# Patient Record
Sex: Male | Born: 1970
Health system: Southern US, Community
[De-identification: ages and names within clinical notes are randomized; demographics above are authoritative.]

## PROBLEM LIST (undated history)

## (undated) DIAGNOSIS — E785 Hyperlipidemia, unspecified: Secondary | ICD-10-CM

## (undated) DIAGNOSIS — F988 Other specified behavioral and emotional disorders with onset usually occurring in childhood and adolescence: Secondary | ICD-10-CM

## (undated) HISTORY — PX: WISDOM TOOTH EXTRACTION: SHX21

## (undated) HISTORY — DX: Other specified behavioral and emotional disorders with onset usually occurring in childhood and adolescence: F98.8

## (undated) HISTORY — DX: Hyperlipidemia, unspecified: E78.5

---

## 2010-07-17 ENCOUNTER — Encounter: Admission: RE | Admit: 2010-07-17 | Discharge: 2010-07-17 | Payer: Self-pay | Admitting: Orthopedic Surgery

## 2018-08-07 ENCOUNTER — Other Ambulatory Visit (HOSPITAL_COMMUNITY): Payer: Self-pay | Admitting: *Deleted

## 2018-08-07 DIAGNOSIS — E785 Hyperlipidemia, unspecified: Secondary | ICD-10-CM

## 2018-08-07 NOTE — Progress Notes (Signed)
Calcium Score CT ordered per Dr. Aundra Dubin.  Called [facility] per patient's request and they have scheduled him for Monday 8/19 @ 3:30.  Patient is aware he will have to pay $199.00 out of pocket.  No further questions.

## 2018-08-10 ENCOUNTER — Other Ambulatory Visit: Payer: Self-pay

## 2019-04-06 DIAGNOSIS — F902 Attention-deficit hyperactivity disorder, combined type: Secondary | ICD-10-CM | POA: Diagnosis not present

## 2019-04-06 DIAGNOSIS — F419 Anxiety disorder, unspecified: Secondary | ICD-10-CM | POA: Diagnosis not present

## 2019-05-24 DIAGNOSIS — E559 Vitamin D deficiency, unspecified: Secondary | ICD-10-CM | POA: Diagnosis not present

## 2019-05-24 DIAGNOSIS — F909 Attention-deficit hyperactivity disorder, unspecified type: Secondary | ICD-10-CM | POA: Diagnosis not present

## 2019-05-24 DIAGNOSIS — E782 Mixed hyperlipidemia: Secondary | ICD-10-CM | POA: Diagnosis not present

## 2019-05-24 DIAGNOSIS — Z79899 Other long term (current) drug therapy: Secondary | ICD-10-CM | POA: Diagnosis not present

## 2019-05-26 DIAGNOSIS — E559 Vitamin D deficiency, unspecified: Secondary | ICD-10-CM | POA: Diagnosis not present

## 2019-05-26 DIAGNOSIS — E782 Mixed hyperlipidemia: Secondary | ICD-10-CM | POA: Diagnosis not present

## 2019-09-06 DIAGNOSIS — E559 Vitamin D deficiency, unspecified: Secondary | ICD-10-CM | POA: Diagnosis not present

## 2019-09-06 DIAGNOSIS — E782 Mixed hyperlipidemia: Secondary | ICD-10-CM | POA: Diagnosis not present

## 2019-09-06 DIAGNOSIS — F909 Attention-deficit hyperactivity disorder, unspecified type: Secondary | ICD-10-CM | POA: Diagnosis not present

## 2019-09-06 DIAGNOSIS — F439 Reaction to severe stress, unspecified: Secondary | ICD-10-CM | POA: Diagnosis not present

## 2019-10-20 ENCOUNTER — Telehealth: Payer: Self-pay | Admitting: Gastroenterology

## 2019-10-20 NOTE — Telephone Encounter (Signed)
Patty, Can you reach out to [facility].  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  He is interested in colon cancer screening first time colonoscopy with some time in the next several weeks or so.  Thanks

## 2019-10-20 NOTE — Telephone Encounter (Signed)
Tried to reach pt and line rang and sounded like someone picked up but never answered.  I tried again and the line sounded as if it was a fax.  Will try later

## 2019-10-21 NOTE — Telephone Encounter (Signed)
Left message on machine to call back  

## 2019-10-22 NOTE — Telephone Encounter (Signed)
Left message on machine to call back   FYI:  Dr Ardis Hughs I have left several messages for the pt to return my call. I will also mail a letter.

## 2019-10-28 ENCOUNTER — Encounter: Payer: Self-pay | Admitting: Gastroenterology

## 2019-11-03 ENCOUNTER — Ambulatory Visit (AMBULATORY_SURGERY_CENTER): Payer: BC Managed Care – PPO | Admitting: *Deleted

## 2019-11-03 ENCOUNTER — Other Ambulatory Visit: Payer: Self-pay

## 2019-11-03 VITALS — Temp 97.3°F | Ht 68.5 in | Wt 148.0 lb

## 2019-11-03 DIAGNOSIS — Z1211 Encounter for screening for malignant neoplasm of colon: Secondary | ICD-10-CM

## 2019-11-03 DIAGNOSIS — Z1159 Encounter for screening for other viral diseases: Secondary | ICD-10-CM

## 2019-11-03 DIAGNOSIS — L409 Psoriasis, unspecified: Secondary | ICD-10-CM | POA: Insufficient documentation

## 2019-11-03 MED ORDER — NA SULFATE-K SULFATE-MG SULF 17.5-3.13-1.6 GM/177ML PO SOLN: ORAL | 0 refills | Status: DC

## 2019-11-03 NOTE — Progress Notes (Signed)
Patient is here in-person for PV. Patient denies any allergies to eggs or soy. Patient denies any problems with anesthesia/sedation. Patient denies any oxygen use at home. Patient denies taking any diet/weight loss medications or blood thinners. Patient is not being treated for MRSA or C-diff. EMMI education assisgned to the patient for the procedure, this was explained and instructions given to patient. COVID-19 screening test is on 11/18 Wed. At 925am, pt requested this date, the pt is aware. Pt is aware that care partner will wait in the car during procedure; if they feel like they will be too hot or cold to wait in the car; they may wait in the 4 th floor lobby. Patient is aware to bring only one care partner. We want them to wear a mask (we do not have any that we can provide them), practice social distancing, and we will check their temperatures when they get here.  I did remind the patient that their care partner needs to stay in the parking lot the entire time and have a cell phone available, we will call them when the pt is ready for discharge. Patient will wear mask into building.    Suprep $15 off coupon given to the patient.

## 2019-11-04 ENCOUNTER — Encounter: Payer: Self-pay | Admitting: Gastroenterology

## 2019-11-10 ENCOUNTER — Other Ambulatory Visit: Payer: Self-pay | Admitting: Gastroenterology

## 2019-11-10 ENCOUNTER — Ambulatory Visit (INDEPENDENT_AMBULATORY_CARE_PROVIDER_SITE_OTHER): Payer: BC Managed Care – PPO

## 2019-11-10 DIAGNOSIS — Z1159 Encounter for screening for other viral diseases: Secondary | ICD-10-CM

## 2019-11-10 LAB — SARS CORONAVIRUS 2 (TAT 6-24 HRS): SARS Coronavirus 2: NEGATIVE

## 2019-11-15 ENCOUNTER — Telehealth: Payer: Self-pay | Admitting: Gastroenterology

## 2019-11-15 ENCOUNTER — Encounter: Payer: Self-pay | Admitting: Gastroenterology

## 2019-11-15 NOTE — Telephone Encounter (Signed)
Left message on machine to call back  

## 2019-11-15 NOTE — Telephone Encounter (Signed)
Lovena Le texted me yesterday.  Starting yesterday his son is quarantining after Covid exposure. Out of abundance of caution I recommended he not prep and that we reschedule his colonoscopy.  Can you reach out to him to find another day for his screening exam.  Thanks

## 2019-11-16 NOTE — Telephone Encounter (Signed)
I spoke with the pt's wife and she tells me that the pt is out of town and will call back to set up colon some time after Dec. 3.  She is aware to have him ask for me at that time.

## 2019-12-24 HISTORY — PX: OTHER SURGICAL HISTORY: SHX169

## 2019-12-30 DIAGNOSIS — E559 Vitamin D deficiency, unspecified: Secondary | ICD-10-CM | POA: Diagnosis not present

## 2019-12-30 DIAGNOSIS — F439 Reaction to severe stress, unspecified: Secondary | ICD-10-CM | POA: Diagnosis not present

## 2019-12-30 DIAGNOSIS — E782 Mixed hyperlipidemia: Secondary | ICD-10-CM | POA: Diagnosis not present

## 2019-12-30 DIAGNOSIS — F909 Attention-deficit hyperactivity disorder, unspecified type: Secondary | ICD-10-CM | POA: Diagnosis not present

## 2020-05-08 ENCOUNTER — Encounter: Payer: Self-pay | Admitting: Family Medicine

## 2020-05-08 ENCOUNTER — Other Ambulatory Visit: Payer: Self-pay

## 2020-05-08 ENCOUNTER — Ambulatory Visit (INDEPENDENT_AMBULATORY_CARE_PROVIDER_SITE_OTHER): Payer: BC Managed Care – PPO | Admitting: Family Medicine

## 2020-05-08 VITALS — BP 124/80 | HR 95 | Temp 99.8°F | Ht 68.25 in | Wt 149.5 lb

## 2020-05-08 DIAGNOSIS — R202 Paresthesia of skin: Secondary | ICD-10-CM | POA: Diagnosis not present

## 2020-05-08 DIAGNOSIS — M21371 Foot drop, right foot: Secondary | ICD-10-CM

## 2020-05-08 DIAGNOSIS — R200 Anesthesia of skin: Secondary | ICD-10-CM | POA: Diagnosis not present

## 2020-05-08 DIAGNOSIS — M5416 Radiculopathy, lumbar region: Secondary | ICD-10-CM | POA: Diagnosis not present

## 2020-05-08 MED ORDER — PREDNISONE 20 MG PO TABS: ORAL_TABLET | ORAL | 0 refills | Status: DC

## 2020-05-08 MED ORDER — AMITRIPTYLINE HCL 25 MG PO TABS: 25.0000 mg | ORAL_TABLET | Freq: Every day | ORAL | 3 refills | Status: DC

## 2020-05-08 NOTE — Progress Notes (Addendum)
Jermaine Shelnutt T. [name], MD, [place] at [facility] [place], [address]  Phone: [phone]  FAX: [phone]  Jermaine Munoz - 49 y.o. male  MRN [id number]  Date of Birth: Feb 21, 1971  Date: 05/08/2020  PCP: System, Provider Not In  Referral: No ref. provider found  Chief Complaint  Patient presents with  . Back Pain    This visit occurred during the SARS-CoV-2 public health emergency.  Safety protocols were in place, including screening questions prior to the visit, additional usage of staff PPE, and extensive cleaning of exam room while observing appropriate contact time as indicated for disinfecting solutions.   Subjective:   Jermaine Munoz is a 49 y.o. very pleasant male patient with Body mass index is 22.57 kg/m. who presents with the following:  He is a pleasant well-known patient, but he is new to our medical practice.  He presents today with some acute right-sided back pain, radicular symptoms, numbness in the right foot and weakness including foot drop.  He has had pain in the right and down his right leg off and on for several years presumably from piriformis syndrome.  He is a very active athlete and runs quite a bit most days of the week.  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  He does have foot drop in the right and this is a new finding for the patient.  He was on-call over the weekend on Friday and then on Saturday it started to get more severe pain.  Sunday his big toe got numb and developed some foot drop and had waves of pain in the back and down his right leg.  He did take some Naprosyn all weekend as well as some Tylenol, but this did not really help his symptoms.  He does not have any history of any prior spine interventions or surgery.  R foot and was having some issues.   Took some naprisyn all weekend. alleve and tlenol   r foot  drop 1st toe r R LE R l foot 1st webspace on the R  Review of Systems is noted in the HPI, as appropriate   Patient Active Problem List   Diagnosis Date Noted  . Psoriasis 11/03/2019  . Nasal trauma 11/23/2012  . 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 ADD (attention deficit disorder)   . Hyperlipidemia     Past Surgical History:  Procedure Laterality Date  . WISDOM TOOTH EXTRACTION      Family History  Problem Relation Age of Onset  . Colon cancer Neg Hx   . Colon polyps Neg Hx   . Esophageal cancer Neg Hx   . Rectal cancer Neg Hx   . Stomach cancer Neg Hx      Objective:   BP 124/80   Pulse 95   Temp 99.8 F (37.7 C) (Temporal)   Ht 5' 8.25" (1.734 m)   Wt 149 lb 8 oz (67.8 kg)   SpO2 97%   BMI 22.57 kg/m    GEN: No acute distress; alert,appropriate.  He is lying on the examination table when I enter the room. PULM: Breathing comfortably in no respiratory distress PSYCH: Normally interactive.   Range of motion at  the waist: Flexion, extension, lateral bending and rotation: Limited to only about 30 to 40 degrees in forward flexion and extension  causes pain.  Lateral bending and rotation are more preserved.  No echymosis or edema Rises to examination table with mild difficulty Gait: minimally antalgic  Inspection/Deformity: N Paraspinus Tenderness: Diffuse from L3-S1 bilaterally as well as pain in the right upper buttocks region near the pelvic rim.  He does have some modest tenderness and tightness on the left piriformis but none on the right.  B Ankle Dorsiflexion (L5,4): 3-/5 B Great Toe Dorsiflexion (L5,4): 3-/5 Heel Walk (L5): Foot drop on the right Toe Walk (S1): WNL Rise/Squat (L4): WNL, mild pain  SENSORY B Medial Foot (L4): WNL B Dorsum (L5): WNL B Lateral (S1): Decreased on the lateral foot as well as the right lateral lower extremity. He also has decreased sensation in the forced dorsal webspace. Both of  these are with pinprick as well as light touch.    B SLR, seated: neg B SLR, supine: neg Both pain only without induction of greater radicular symptoms.  B FABER: neg B Reverse FABER: Tightness and pain in the buttocks region only B Greater Troch: NT B Log Roll: neg B Sciatic Notch: NT   Radiology: No results found.  Assessment and Plan:     ICD-10-CM   1. Lumbar radiculopathy, acute  M54.16   2. Foot drop, right  M21.371   3. Numbness and tingling of right lower extremity  R20.0    R20.2    Level of Medical Decision-Making in this case is MODERATE.   Acute right-sided lumbar radiculopathy with right lateral numbness in the lower extremity as well as foot drop.  Clinically secondary to acute disc herniation to the right.  The patient is well versed in rehab and stretching and he is going to do piriformis stretching as well as some basic back rehab as well.  14 days of prednisone, amitriptyline at night to stand for neuropathic pain and sleep.  Follow-up: Return in about 1 month (around 06/08/2020).  Meds ordered this encounter  Medications  . predniSONE (DELTASONE) 20 MG tablet    Sig: 2 tabs po for 7 days, then 1 tab po for 7 days    Dispense:  21 tablet    Refill:  0  . amitriptyline (ELAVIL) 25 MG tablet    Sig: Take 1-2 tablets (25-50 mg total) by mouth at bedtime.    Dispense:  60 tablet    Refill:  3   Medications Discontinued During This Encounter  Medication Reason  . atorvastatin (LIPITOR) 20 MG tablet Change in therapy  . Na Sulfate-K Sulfate-Mg Sulf 17.5-3.13-1.6 GM/177ML SOLN    No orders of the defined types were placed in this encounter.   Signed,  Maud Deed. Suleima Ohlendorf, MD   Outpatient Encounter Medications as of 05/08/2020  Medication Sig  . atorvastatin (LIPITOR) 10 MG tablet Take 10 mg by mouth daily.  . citalopram (CELEXA) 20 MG tablet Take 30 mg by mouth daily.  Marland Kitchen lisdexamfetamine (VYVANSE) 30 MG capsule Take 30 mg by mouth daily.  Marland Kitchen  amitriptyline (ELAVIL) 25 MG tablet Take 1-2 tablets (25-50 mg total) by mouth at bedtime.  . predniSONE (DELTASONE) 20 MG tablet 2 tabs po for 7 days, then 1 tab po for 7 days  . [DISCONTINUED] atorvastatin (LIPITOR) 20 MG tablet Take 10 mg by mouth daily.   . [DISCONTINUED] Na Sulfate-K Sulfate-Mg Sulf 17.5-3.13-1.6 GM/177ML SOLN Suprep (no substitutions)-TAKE AS DIRECTED.   No facility-administered encounter medications on file as of 05/08/2020.

## 2020-05-30 ENCOUNTER — Other Ambulatory Visit: Payer: Self-pay | Admitting: Family Medicine

## 2020-08-18 ENCOUNTER — Other Ambulatory Visit: Payer: Self-pay

## 2020-08-30 ENCOUNTER — Other Ambulatory Visit: Payer: Self-pay | Admitting: Family Medicine

## 2020-08-30 MED ORDER — PREDNISONE 20 MG PO TABS: ORAL_TABLET | ORAL | 0 refills | Status: DC

## 2020-08-30 NOTE — Progress Notes (Signed)
We discussed verbally, and he has had a recurrence of radiculopathy, so I sent him in another round of prednisone.   Meds ordered this encounter  Medications   predniSONE (DELTASONE) 20 MG tablet    Sig: 2 tabs po for 7 days, then 1 tab po for 7 days    Dispense:  21 tablet    Refill:  0

## 2020-09-06 DIAGNOSIS — M5416 Radiculopathy, lumbar region: Secondary | ICD-10-CM | POA: Diagnosis not present

## 2020-09-06 DIAGNOSIS — M545 Low back pain: Secondary | ICD-10-CM | POA: Diagnosis not present

## 2020-09-07 ENCOUNTER — Ambulatory Visit
Admission: RE | Admit: 2020-09-07 | Discharge: 2020-09-07 | Disposition: A | Discharge status: Home / Self Care | Payer: BC Managed Care – PPO | Source: Ambulatory Visit | Attending: Family Medicine | Admitting: Family Medicine

## 2020-09-07 DIAGNOSIS — M4807 Spinal stenosis, lumbosacral region: Secondary | ICD-10-CM | POA: Diagnosis not present

## 2020-09-07 DIAGNOSIS — M21371 Foot drop, right foot: Secondary | ICD-10-CM

## 2020-09-07 DIAGNOSIS — R202 Paresthesia of skin: Secondary | ICD-10-CM

## 2020-09-07 DIAGNOSIS — M5416 Radiculopathy, lumbar region: Secondary | ICD-10-CM

## 2020-09-07 MED ORDER — PREDNISONE 20 MG PO TABS: 40.0000 mg | ORAL_TABLET | Freq: Every day | ORAL | 0 refills | Status: AC

## 2020-09-07 NOTE — Addendum Note (Signed)
Addended by: Owens Loffler on: 09/07/2020 12:08 PM   Modules accepted: Orders

## 2020-09-09 ENCOUNTER — Other Ambulatory Visit: Payer: Self-pay | Admitting: Family Medicine

## 2020-09-10 ENCOUNTER — Other Ambulatory Visit: Payer: Self-pay | Admitting: Family Medicine

## 2020-09-12 DIAGNOSIS — M5416 Radiculopathy, lumbar region: Secondary | ICD-10-CM | POA: Diagnosis not present

## 2020-09-12 DIAGNOSIS — M545 Low back pain: Secondary | ICD-10-CM | POA: Diagnosis not present

## 2020-09-15 ENCOUNTER — Ambulatory Visit
Admission: RE | Admit: 2020-09-15 | Discharge: 2020-09-15 | Disposition: A | Discharge status: Home / Self Care | Payer: BC Managed Care – PPO | Source: Ambulatory Visit | Attending: Family Medicine | Admitting: Family Medicine

## 2020-09-15 ENCOUNTER — Other Ambulatory Visit: Payer: Self-pay

## 2020-09-15 ENCOUNTER — Other Ambulatory Visit: Payer: Self-pay | Admitting: Family Medicine

## 2020-09-15 DIAGNOSIS — R200 Anesthesia of skin: Secondary | ICD-10-CM

## 2020-09-15 DIAGNOSIS — R202 Paresthesia of skin: Secondary | ICD-10-CM

## 2020-09-15 DIAGNOSIS — M4727 Other spondylosis with radiculopathy, lumbosacral region: Secondary | ICD-10-CM | POA: Diagnosis not present

## 2020-09-15 DIAGNOSIS — M5117 Intervertebral disc disorders with radiculopathy, lumbosacral region: Secondary | ICD-10-CM | POA: Diagnosis not present

## 2020-09-15 DIAGNOSIS — M21371 Foot drop, right foot: Secondary | ICD-10-CM

## 2020-09-15 DIAGNOSIS — M5416 Radiculopathy, lumbar region: Secondary | ICD-10-CM

## 2020-09-15 DIAGNOSIS — M48061 Spinal stenosis, lumbar region without neurogenic claudication: Secondary | ICD-10-CM | POA: Diagnosis not present

## 2020-09-15 NOTE — Progress Notes (Signed)
MRI order:  He is a very well-known gentleman and originally saw him in May, 2021.  I have communicated with him a few other times in that interval.  He has failed multiple rounds of oral prednisone, and he has been doing formal physical therapy as well as daily home exercise program.  At this point he is failed conservative management and he still has at this point severe back pain with radicular symptoms and some foot drop on the right side.  Obtain an MRI of the lumbar spine to evaluate for cord edema, spinal stenosis, foraminal stenosis or other acute lumbar spine pathology that would be causing his radicular symptoms and pain on the right.    ICD-10-CM   1. Lumbar radiculopathy, acute  M54.16 MR Lumbar Spine Wo Contrast  2. Foot drop, right  M21.371 MR Lumbar Spine Wo Contrast  3. Numbness and tingling of right lower extremity  R20.0 MR Lumbar Spine Wo Contrast   R20.2   \

## 2020-09-16 ENCOUNTER — Other Ambulatory Visit: Payer: Self-pay | Admitting: Family Medicine

## 2020-09-16 MED ORDER — PREDNISONE 20 MG PO TABS: ORAL_TABLET | ORAL | 0 refills | Status: DC

## 2020-09-17 DIAGNOSIS — M5126 Other intervertebral disc displacement, lumbar region: Secondary | ICD-10-CM | POA: Diagnosis not present

## 2020-09-19 DIAGNOSIS — M5126 Other intervertebral disc displacement, lumbar region: Secondary | ICD-10-CM | POA: Diagnosis not present

## 2020-09-19 DIAGNOSIS — M5117 Intervertebral disc disorders with radiculopathy, lumbosacral region: Secondary | ICD-10-CM | POA: Diagnosis not present

## 2020-10-18 DIAGNOSIS — M5416 Radiculopathy, lumbar region: Secondary | ICD-10-CM | POA: Diagnosis not present

## 2020-11-23 ENCOUNTER — Other Ambulatory Visit: Payer: Self-pay | Admitting: Family Medicine

## 2020-11-23 NOTE — Telephone Encounter (Signed)
Last office visit 05/08/2020 for Lumbar radiculopathy.  Last refilled 05/30/2020 for #180 with no refills.  No future appointments.  Refill?

## 2021-02-07 DIAGNOSIS — E785 Hyperlipidemia, unspecified: Secondary | ICD-10-CM | POA: Diagnosis not present

## 2021-02-07 DIAGNOSIS — F909 Attention-deficit hyperactivity disorder, unspecified type: Secondary | ICD-10-CM | POA: Diagnosis not present

## 2021-04-05 DIAGNOSIS — E785 Hyperlipidemia, unspecified: Secondary | ICD-10-CM | POA: Diagnosis not present

## 2021-04-05 DIAGNOSIS — F439 Reaction to severe stress, unspecified: Secondary | ICD-10-CM | POA: Diagnosis not present

## 2021-04-05 DIAGNOSIS — F909 Attention-deficit hyperactivity disorder, unspecified type: Secondary | ICD-10-CM | POA: Diagnosis not present

## 2021-04-05 DIAGNOSIS — E559 Vitamin D deficiency, unspecified: Secondary | ICD-10-CM | POA: Diagnosis not present

## 2021-07-06 DIAGNOSIS — Z1211 Encounter for screening for malignant neoplasm of colon: Secondary | ICD-10-CM | POA: Diagnosis not present

## 2021-10-08 DIAGNOSIS — E785 Hyperlipidemia, unspecified: Secondary | ICD-10-CM | POA: Diagnosis not present

## 2021-10-08 DIAGNOSIS — F439 Reaction to severe stress, unspecified: Secondary | ICD-10-CM | POA: Diagnosis not present

## 2021-10-08 DIAGNOSIS — F909 Attention-deficit hyperactivity disorder, unspecified type: Secondary | ICD-10-CM | POA: Diagnosis not present

## 2022-02-26 DIAGNOSIS — F909 Attention-deficit hyperactivity disorder, unspecified type: Secondary | ICD-10-CM | POA: Diagnosis not present

## 2022-02-26 DIAGNOSIS — F439 Reaction to severe stress, unspecified: Secondary | ICD-10-CM | POA: Diagnosis not present

## 2022-02-26 DIAGNOSIS — E785 Hyperlipidemia, unspecified: Secondary | ICD-10-CM | POA: Diagnosis not present

## 2022-03-01 IMAGING — MR MR LUMBAR SPINE W/O CM
4 of 5 series · 25 of 48 positions shown · non-contrast
Comparison: Lumbar radiographs 09/07/2020

CLINICAL DATA: Right lower extremity radiculopathy and right foot
drop.

EXAM:
MRI LUMBAR SPINE WITHOUT CONTRAST
TECHNIQUE: Multiplanar, multisequence MR imaging of the lumbar spine was
performed. No intravenous contrast was administered.

[Series 4: T2 · sagittal · 4.0mm · 0.55mm/px · 6 of 17 slices shown (1 of 2)]
[im 1/17]
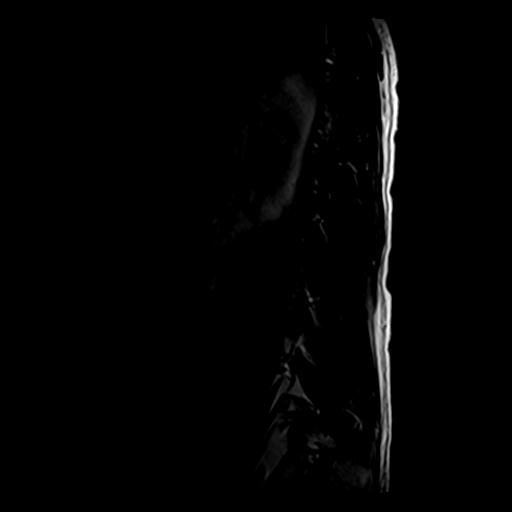
[im 4/17]
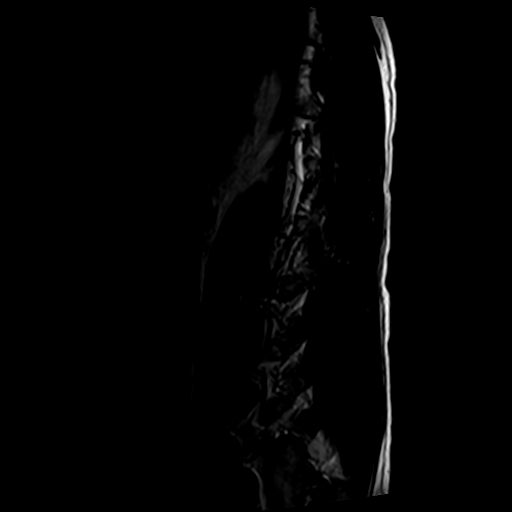
[im 7/17]
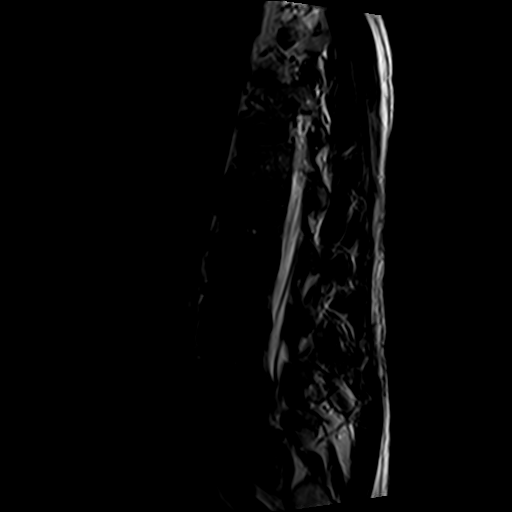
[im 10/17]
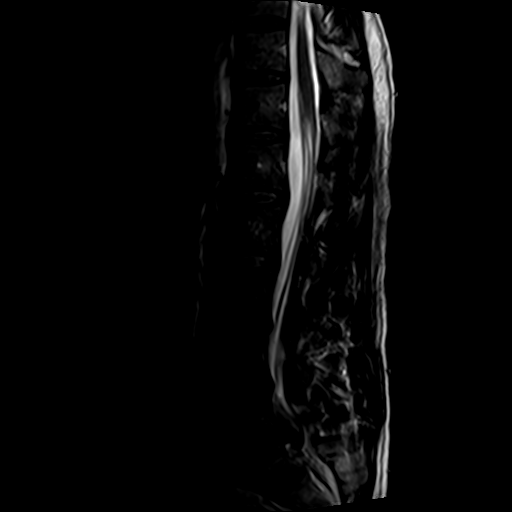
[im 13/17]
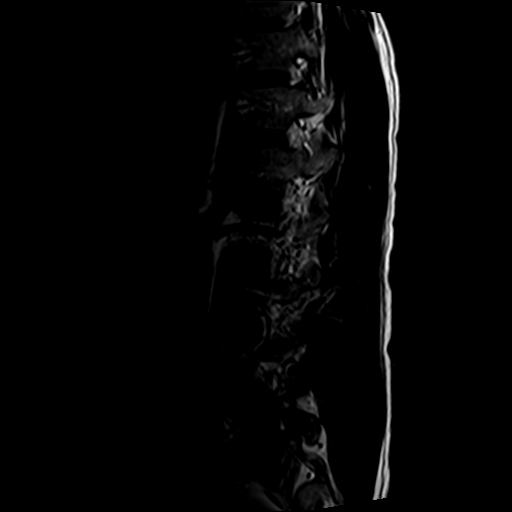
[im 17/17]
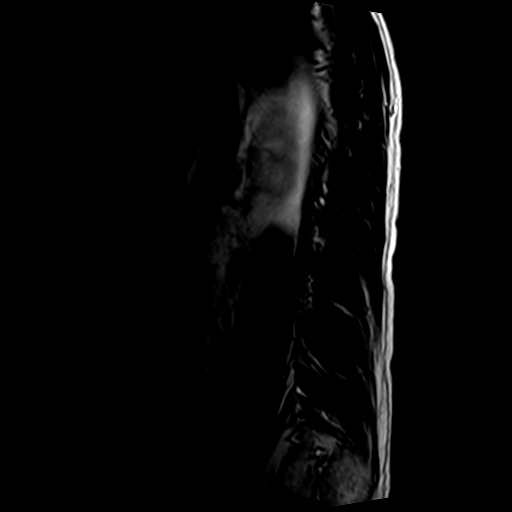

[Series 6: T1 · sagittal · 4.0mm · 0.55mm/px · 7 of 17 slices shown (1 of 2)]
[im 1/17]
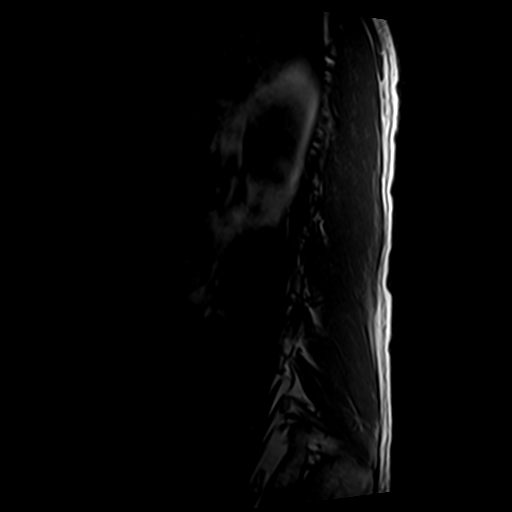
[im 3/17]
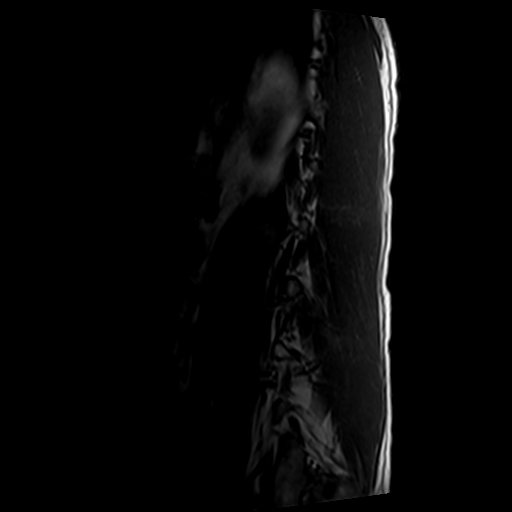
[im 6/17]
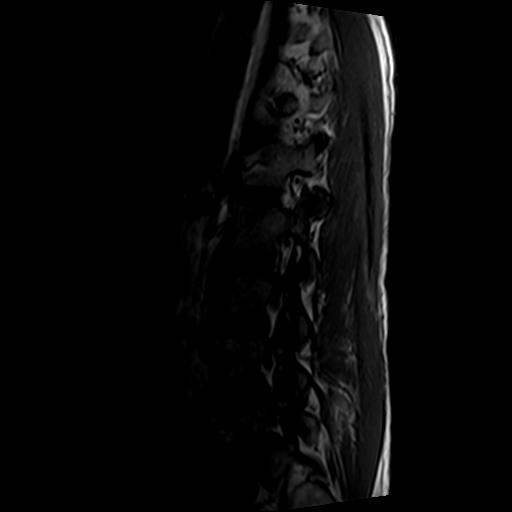
[im 9/17]
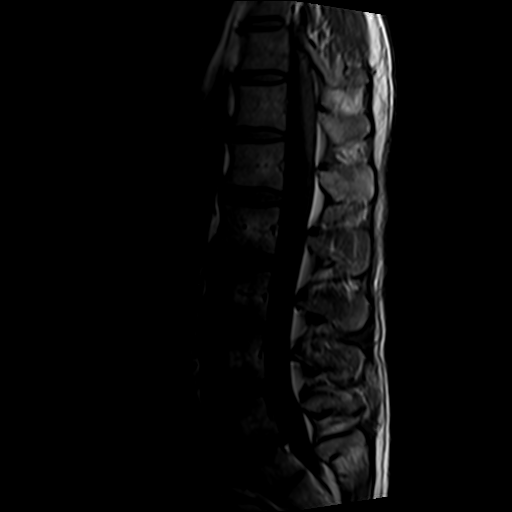
[im 11/17]
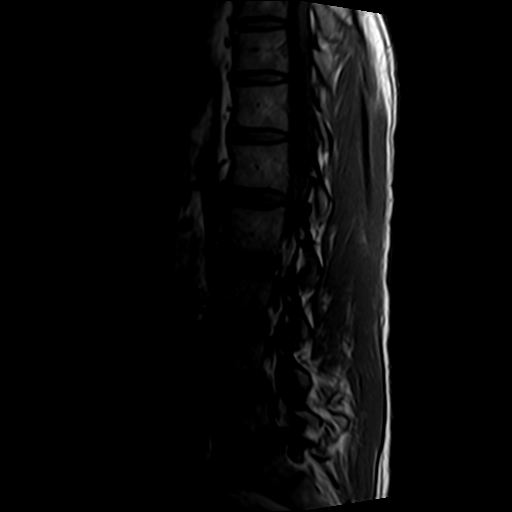
[im 14/17]
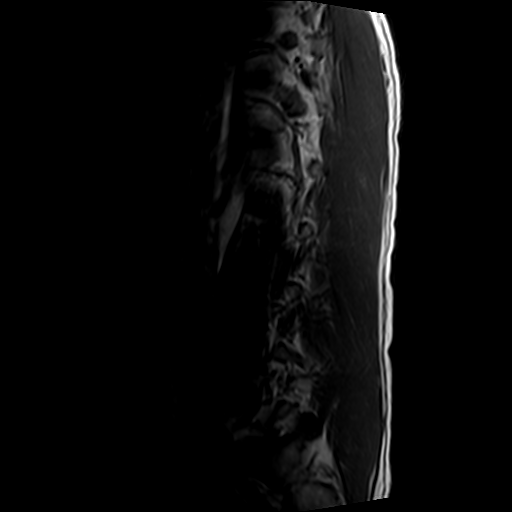
[im 17/17]
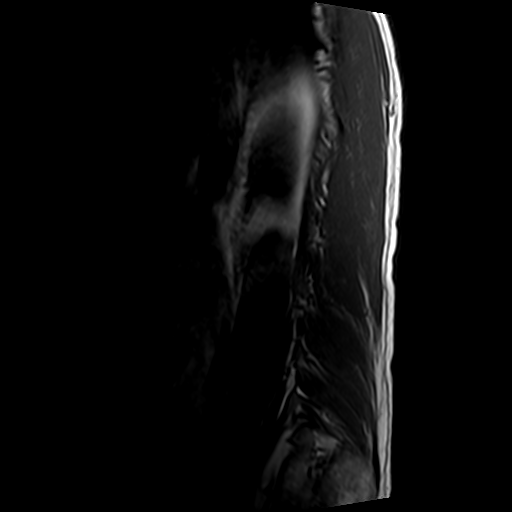

[Series 7: T2 · axial · 4.0mm · 0.70mm/px · z∈[-83,+109]mm · 8 of 37 slices shown (2 of 2)]
[im 1/37]
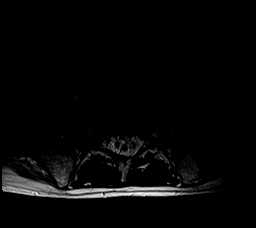
[im 6/37]
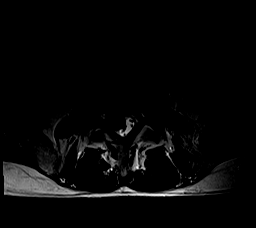
[im 12/37]
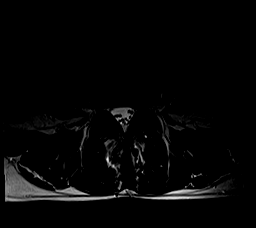
[im 17/37]
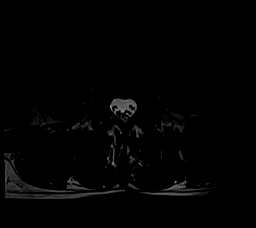
[im 20/37]
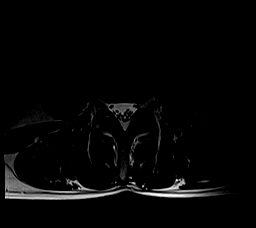
[im 25/37]
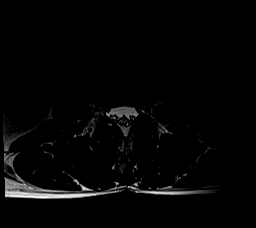
[im 31/37]
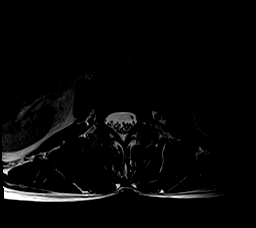
[im 37/37]
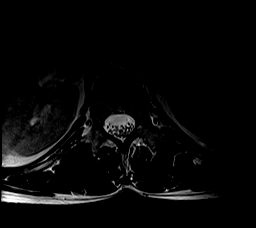

[Series 8: T1 · axial · 4.0mm · 0.35mm/px · z∈[-83,+78]mm · 4 of 37 slices shown (2 of 2)]
[im 1/37]
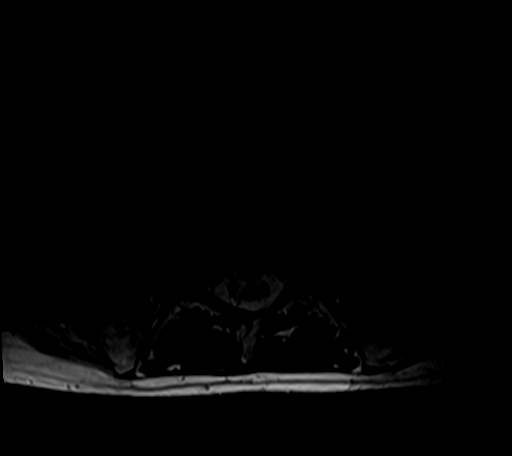
[im 6/37]
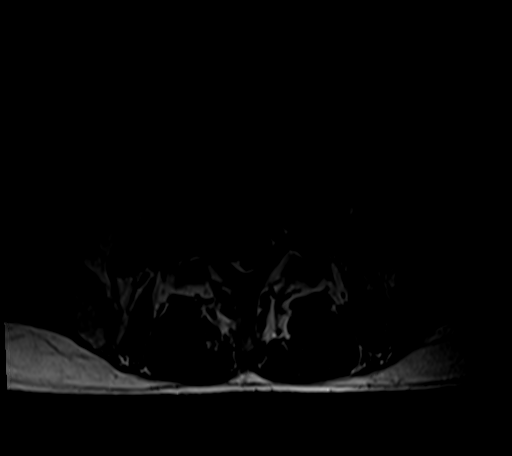
[im 20/37]
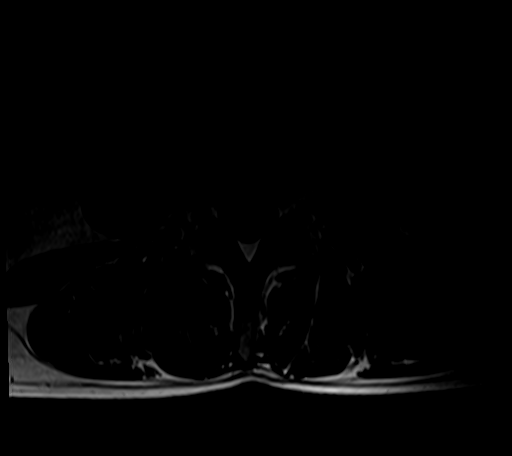
[im 31/37]
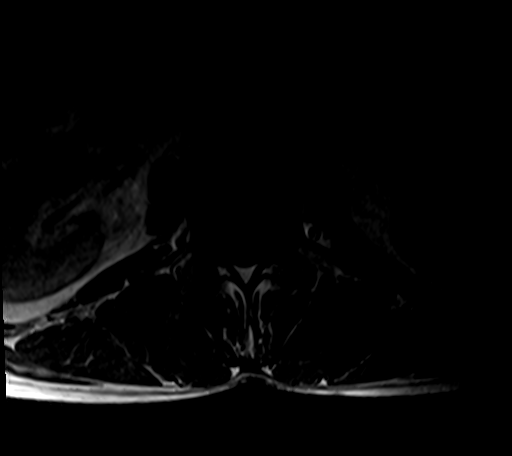

[25 of 48 positions shown; findings below may reference images not displayed]

FINDINGS: Segmentation: There are five lumbar type vertebral bodies. The last
full intervertebral disc space is labeled L5-S1. This correlates
with the lumbar radiographs

Alignment:  Normal

Vertebrae: Normal marrow signal. No bone lesions or fractures. Mild
degenerative endplate reactive changes at L5-S1.

Conus medullaris and cauda equina: Conus extends to the L1 level.
Conus and cauda equina appear normal.

Paraspinal and other soft tissues: No significant paraspinal or
retroperitoneal findings.

Disc levels:

L1-2: No significant findings.

L2-3: No significant findings.

L3-4: Mild disc desiccation and slight disc space narrowing. Slight
bulging annulus with mild flattening of the ventral thecal sac. No
spinal, lateral recess or foraminal stenosis.

L4-5: Mild annular bulge and mild facet disease but no disc
protrusions, spinal or foraminal stenosis.

L5-S1: There is a very large right paracentral disc extrusion both
up behind the L5 vertebral body and down behind the S1 vertebral
body. There is significant mass effect on the right side of the
thecal sac and on the right S1 nerve root. There could be irritation
of the right S2 nerve root also. The L5 nerve root does not appear
to be affected. Mild to moderate facet disease at this level.
IMPRESSION: Very large right paracentral disc extrusion at L5-S1 with
significant mass effect on the right side of the thecal sac and on
the right S1 nerve root.

## 2022-05-06 ENCOUNTER — Encounter: Payer: Self-pay | Admitting: Gastroenterology

## 2022-06-17 ENCOUNTER — Ambulatory Visit (AMBULATORY_SURGERY_CENTER): Payer: Self-pay

## 2022-06-17 VITALS — Ht 68.5 in | Wt 149.0 lb

## 2022-06-17 DIAGNOSIS — Z1211 Encounter for screening for malignant neoplasm of colon: Secondary | ICD-10-CM

## 2022-06-17 MED ORDER — NA SULFATE-K SULFATE-MG SULF 17.5-3.13-1.6 GM/177ML PO SOLN: 1.0000 | Freq: Once | ORAL | 0 refills | Status: AC

## 2022-06-17 NOTE — Progress Notes (Signed)
No egg or soy allergy known to patient  No issues known to pt with past sedation with any surgeries or procedures Patient denies ever being told they had issues or difficulty with intubation  No FH of Malignant Hyperthermia Pt is not on diet pills Pt is not on  home 02  Pt is not on blood thinners  Pt denies issues with constipation  No A fib or A flutter   NO PA's for preps discussed with pt In PV today  Discussed with pt there will be an out-of-pocket cost for prep and that varies from $0 to 70 +  dollars - pt verbalized understanding  Pt instructed to use Singlecare.com or GoodRx for a price reduction on prep   PV completed over the phone. Pt verified name, DOB, address and insurance during PV today.   Pt mailed instruction packet with copy of consent form to read and not return, and instructions.   Pt encouraged to call with questions or issues.  If pt has My chart, procedure instructions sent via My Chart  Insurance confirmed with pt at PV today   

## 2022-07-08 ENCOUNTER — Encounter: Payer: Self-pay | Admitting: Certified Registered Nurse Anesthetist

## 2022-07-10 ENCOUNTER — Encounter: Payer: Self-pay | Admitting: Gastroenterology

## 2022-07-15 ENCOUNTER — Ambulatory Visit (AMBULATORY_SURGERY_CENTER): Payer: BC Managed Care – PPO | Admitting: Gastroenterology

## 2022-07-15 ENCOUNTER — Encounter: Payer: Self-pay | Admitting: Gastroenterology

## 2022-07-15 VITALS — BP 106/66 | HR 69 | Temp 99.3°F | Resp 13 | Ht 68.0 in | Wt 149.0 lb

## 2022-07-15 DIAGNOSIS — D125 Benign neoplasm of sigmoid colon: Secondary | ICD-10-CM | POA: Diagnosis not present

## 2022-07-15 DIAGNOSIS — Z1211 Encounter for screening for malignant neoplasm of colon: Secondary | ICD-10-CM

## 2022-07-15 MED ORDER — SODIUM CHLORIDE 0.9 % IV SOLN: 500.0000 mL | Freq: Once | INTRAVENOUS | Status: DC

## 2022-07-15 NOTE — Progress Notes (Signed)
HPI: This is a man with routine risk for CRC   ROS: complete GI ROS as described in HPI, all other review negative.  Constitutional:  No unintentional weight loss   Past Medical History:  Diagnosis Date   ADD (attention deficit disorder)    Hyperlipidemia     Past Surgical History:  Procedure Laterality Date   discectemy Right 2021   L5-S1   WISDOM TOOTH EXTRACTION      Current Outpatient Medications  Medication Sig Dispense Refill   atorvastatin (LIPITOR) 20 MG tablet Take 20 mg by mouth daily.     citalopram (CELEXA) 40 MG tablet Take 40 mg by mouth daily. Takes total of 30 mg daily     lisdexamfetamine (VYVANSE) 30 MG capsule Take 30 mg by mouth daily.     PREVIDENT 5000 PLUS 1.1 % CREA dental cream Take by mouth daily.     Current Facility-Administered Medications  Medication Dose Route Frequency Provider Last Rate Last Admin   0.9 %  sodium chloride infusion  500 mL Intravenous Once Milus Banister, MD        Allergies as of 07/15/2022   (No Known Allergies)    Family History  Problem Relation Age of Onset   Colon cancer Neg Hx    Colon polyps Neg Hx    Esophageal cancer Neg Hx    Rectal cancer Neg Hx    Stomach cancer Neg Hx     Social History   Socioeconomic History   Marital status: Married    Spouse name: Not on file   Number of children: Not on file   Years of education: Not on file   Highest education level: Not on file  Occupational History   Not on file  Tobacco Use   Smoking status: Never   Smokeless tobacco: Never  Vaping Use   Vaping Use: Never used  Substance and Sexual Activity   Alcohol use: Yes    Comment: once a week wine   Drug use: Never   Sexual activity: Yes  Other Topics Concern   Not on file  Social History Narrative   Not on file   Social Determinants of Health   Financial Resource Strain: Not on file  Food Insecurity: Not on file  Transportation Needs: Not on file  Physical Activity: Not on file  Stress: Not  on file  Social Connections: Not on file  Intimate Partner Violence: Not on file     Physical Exam: BP (!) 125/57   Pulse (!) 104   Temp 99.3 F (37.4 C)   Ht '5\' 8"'$  (1.727 m)   Wt 149 lb (67.6 kg)   SpO2 98%   BMI 22.66 kg/m  Constitutional: generally well-appearing Psychiatric: alert and oriented x3 Lungs: CTA bilaterally Heart: no MCR  Assessment and plan: 51 y.o. male with routine risk for CRC  Colonoscopy today  Care is appropriate for the ambulatory setting.  Owens Loffler, MD [facility] 07/15/2022, 1:03 PM

## 2022-07-15 NOTE — Progress Notes (Signed)
Report given to PACU, vss 

## 2022-07-15 NOTE — Op Note (Signed)
[name] Procedure Date: 07/15/2022 1:25 PM MRN: [id number] Endoscopist: Milus Banister , MD Age: 51 Referring MD:  Date of Birth: July 08, 1971 Gender: Male Account #: [id number] Procedure:                Colonoscopy Indications:              Screening for colorectal malignant neoplasm Medicines:                Monitored Anesthesia Care Procedure:                Pre-Anesthesia Assessment:                           - Prior to the procedure, a History and Physical                            was performed, and patient medications and                            allergies were reviewed. The patient's tolerance of                            previous anesthesia was also reviewed. The risks                            and benefits of the procedure and the sedation                            options and risks were discussed with the patient.                            All questions were answered, and informed consent                            was obtained. Prior Anticoagulants: The patient has                            taken no previous anticoagulant or antiplatelet                            agents. ASA Grade Assessment: II - A patient with                            mild systemic disease. After reviewing the risks                            and benefits, the patient was deemed in                            satisfactory condition to undergo the procedure.                           After obtaining informed consent, the colonoscope  was passed under direct vision. Throughout the                            procedure, the patient's blood pressure, pulse, and                            oxygen saturations were monitored continuously. The                            Olympus CF-HQ190L was introduced through the anus                            and advanced to the the cecum, identified by                            appendiceal orifice  and ileocecal valve. The                            colonoscopy was performed without difficulty. The                            patient tolerated the procedure well. The quality                            of the bowel preparation was excellent. The                            ileocecal valve, appendiceal orifice, and rectum                            were photographed. Scope In: 1:28:18 PM Scope Out: 1:40:31 PM Scope Withdrawal Time: 0 hours 9 minutes 24 seconds  Total Procedure Duration: 0 hours 12 minutes 13 seconds  Findings:                 A 2 mm polyp was found in the sigmoid colon. The                            polyp was sessile. The polyp was removed with a                            cold snare. Resection and retrieval were complete.                           Internal hemorrhoids were found. The hemorrhoids                            were small.                           The exam was otherwise without abnormality on                            direct and retroflexion views. Complications:  No immediate complications. Estimated blood loss:                            None. Estimated Blood Loss:     Estimated blood loss: none. Impression:               - One 2 mm polyp in the sigmoid colon, removed with                            a cold snare. Resected and retrieved.                           - Internal hemorrhoids.                           - The examination was otherwise normal on direct                            and retroflexion views. Recommendation:           - Patient has a contact number available for                            emergencies. The signs and symptoms of potential                            delayed complications were discussed with the                            patient. Return to normal activities tomorrow.                            Written discharge instructions were provided to the                            patient.                           -  Resume previous diet.                           - Continue present medications.                           - Await pathology results. Milus Banister, MD 07/15/2022 1:43:43 PM This report has been signed electronically.

## 2022-07-15 NOTE — Patient Instructions (Signed)
Handout given for polyps.  YOU HAD AN ENDOSCOPIC PROCEDURE TODAY AT [facility]:   Refer to the procedure report that was given to you for any specific questions about what was found during the examination.  If the procedure report does not answer your questions, please call your gastroenterologist to clarify.  If you requested that your care partner not be given the details of your procedure findings, then the procedure report has been included in a sealed envelope for you to review at your convenience later.  YOU SHOULD EXPECT: Some feelings of bloating in the abdomen. Passage of more gas than usual.  Walking can help get rid of the air that was put into your GI tract during the procedure and reduce the bloating. If you had a lower endoscopy (such as a colonoscopy or flexible sigmoidoscopy) you may notice spotting of blood in your stool or on the toilet paper. If you underwent a bowel prep for your procedure, you may not have a normal bowel movement for a few days.  Please Note:  You might notice some irritation and congestion in your nose or some drainage.  This is from the oxygen used during your procedure.  There is no need for concern and it should clear up in a day or so.  SYMPTOMS TO REPORT IMMEDIATELY:  Following lower endoscopy (colonoscopy):  Excessive amounts of blood in the stool  Significant tenderness or worsening of abdominal pains  Swelling of the abdomen that is new, acute  Fever of 100F or higher  For urgent or emergent issues, a gastroenterologist can be reached at any hour by calling [phone]. Do not use MyChart messaging for urgent concerns.    DIET:  We do recommend a small meal at first, but then you may proceed to your regular diet.  Drink plenty of fluids but you should avoid alcoholic beverages for 24 hours.  ACTIVITY:  You should plan to take it easy for the rest of today and you should NOT DRIVE or use heavy machinery until tomorrow (because  of the sedation medicines used during the test).    FOLLOW UP: Our staff will call the number listed on your records the next business day following your procedure.  We will call around 7:15- 8:00 am to check on you and address any questions or concerns that you may have regarding the information given to you following your procedure. If we do not reach you, we will leave a message.  If you develop any symptoms (ie: fever, flu-like symptoms, shortness of breath, cough etc.) before then, please call [phone].  If you test positive for Covid 19 in the 2 weeks post procedure, please call and report this information to [place].    If any biopsies were taken you will be contacted by phone or by letter within the next 1-3 weeks.  Please call us at [phone] if you have not heard about the biopsies in 3 weeks.    SIGNATURES/CONFIDENTIALITY: You and/or your care partner have signed paperwork which will be entered into your electronic medical record.  These signatures attest to the fact that that the information above on your After Visit Summary has been reviewed and is understood.  Full responsibility of the confidentiality of this discharge information lies with you and/or your care-partner.

## 2022-07-15 NOTE — Progress Notes (Signed)
Pt's states no medical or surgical changes since previsit or office visit. 

## 2022-07-15 NOTE — Progress Notes (Signed)
Called to room to assist during endoscopic procedure.  Patient ID and intended procedure confirmed with present staff. Received instructions for my participation in the procedure from the performing physician.  

## 2022-07-16 ENCOUNTER — Telehealth: Payer: Self-pay | Admitting: *Deleted

## 2022-07-16 NOTE — Telephone Encounter (Signed)
  Follow up Call-     07/15/2022   12:44 PM  Call back number  Post procedure Call Back phone  # [phone]  Permission to leave phone message Yes     Patient questions:  Do you have a fever, pain , or abdominal swelling? No. Pain Score  0 *  Have you tolerated food without any problems? Yes.    Have you been able to return to your normal activities? Yes.    Do you have any questions about your discharge instructions: Diet   No. Medications  No. Follow up visit  No.  Do you have questions or concerns about your Care? No.  Actions: * If pain score is 4 or above: No action needed, pain <4.

## 2022-08-27 DIAGNOSIS — F909 Attention-deficit hyperactivity disorder, unspecified type: Secondary | ICD-10-CM | POA: Diagnosis not present

## 2022-10-09 DIAGNOSIS — D2261 Melanocytic nevi of right upper limb, including shoulder: Secondary | ICD-10-CM | POA: Diagnosis not present

## 2022-10-09 DIAGNOSIS — L821 Other seborrheic keratosis: Secondary | ICD-10-CM | POA: Diagnosis not present

## 2022-10-09 DIAGNOSIS — D225 Melanocytic nevi of trunk: Secondary | ICD-10-CM | POA: Diagnosis not present

## 2022-10-09 DIAGNOSIS — D2262 Melanocytic nevi of left upper limb, including shoulder: Secondary | ICD-10-CM | POA: Diagnosis not present

## 2022-11-13 DIAGNOSIS — M6281 Muscle weakness (generalized): Secondary | ICD-10-CM | POA: Diagnosis not present

## 2022-11-13 DIAGNOSIS — R279 Unspecified lack of coordination: Secondary | ICD-10-CM | POA: Diagnosis not present

## 2022-11-13 DIAGNOSIS — R293 Abnormal posture: Secondary | ICD-10-CM | POA: Diagnosis not present

## 2022-12-25 DIAGNOSIS — M6281 Muscle weakness (generalized): Secondary | ICD-10-CM | POA: Diagnosis not present

## 2022-12-25 DIAGNOSIS — R279 Unspecified lack of coordination: Secondary | ICD-10-CM | POA: Diagnosis not present

## 2022-12-25 DIAGNOSIS — R293 Abnormal posture: Secondary | ICD-10-CM | POA: Diagnosis not present

## 2023-01-08 DIAGNOSIS — R293 Abnormal posture: Secondary | ICD-10-CM | POA: Diagnosis not present

## 2023-01-08 DIAGNOSIS — M6281 Muscle weakness (generalized): Secondary | ICD-10-CM | POA: Diagnosis not present

## 2023-01-08 DIAGNOSIS — R279 Unspecified lack of coordination: Secondary | ICD-10-CM | POA: Diagnosis not present

## 2023-01-14 ENCOUNTER — Other Ambulatory Visit: Payer: Self-pay | Admitting: Family Medicine

## 2023-01-14 MED ORDER — DOXYCYCLINE HYCLATE 100 MG PO TABS: 100.0000 mg | ORAL_TABLET | Freq: Two times a day (BID) | ORAL | 0 refills | Status: AC

## 2023-01-14 NOTE — Progress Notes (Signed)
The patient who I have seen before contacted me and sent me some pictures of his hand that looks like cellulitis, and I will send him in some antibiotics.

## 2023-01-15 DIAGNOSIS — M6281 Muscle weakness (generalized): Secondary | ICD-10-CM | POA: Diagnosis not present

## 2023-01-15 DIAGNOSIS — R279 Unspecified lack of coordination: Secondary | ICD-10-CM | POA: Diagnosis not present

## 2023-01-15 DIAGNOSIS — R293 Abnormal posture: Secondary | ICD-10-CM | POA: Diagnosis not present

## 2023-01-21 ENCOUNTER — Other Ambulatory Visit (HOSPITAL_COMMUNITY): Payer: Self-pay

## 2023-01-21 MED ORDER — LISDEXAMFETAMINE DIMESYLATE 30 MG PO CAPS
30.0000 mg | ORAL_CAPSULE | ORAL | 0 refills | Status: DC
  Filled 2023-01-21: qty 30, 30d supply, fill #0

## 2023-01-22 ENCOUNTER — Other Ambulatory Visit (HOSPITAL_COMMUNITY): Payer: Self-pay

## 2023-02-24 ENCOUNTER — Other Ambulatory Visit (HOSPITAL_COMMUNITY): Payer: Self-pay

## 2023-02-24 MED ORDER — LISDEXAMFETAMINE DIMESYLATE 30 MG PO CAPS
30.0000 mg | ORAL_CAPSULE | Freq: Every morning | ORAL | 0 refills | Status: DC
  Filled 2023-02-24: qty 30, 30d supply, fill #0

## 2023-02-25 ENCOUNTER — Other Ambulatory Visit (HOSPITAL_COMMUNITY): Payer: Self-pay

## 2023-02-26 DIAGNOSIS — N393 Stress incontinence (female) (male): Secondary | ICD-10-CM | POA: Diagnosis not present

## 2023-02-26 DIAGNOSIS — R350 Frequency of micturition: Secondary | ICD-10-CM | POA: Diagnosis not present

## 2023-02-26 DIAGNOSIS — N3281 Overactive bladder: Secondary | ICD-10-CM | POA: Diagnosis not present

## 2023-02-26 DIAGNOSIS — N816 Rectocele: Secondary | ICD-10-CM | POA: Diagnosis not present

## 2023-03-19 DIAGNOSIS — N393 Stress incontinence (female) (male): Secondary | ICD-10-CM | POA: Diagnosis not present

## 2023-03-19 DIAGNOSIS — N3281 Overactive bladder: Secondary | ICD-10-CM | POA: Diagnosis not present

## 2023-03-19 DIAGNOSIS — R350 Frequency of micturition: Secondary | ICD-10-CM | POA: Diagnosis not present

## 2023-03-24 ENCOUNTER — Other Ambulatory Visit (HOSPITAL_COMMUNITY): Payer: Self-pay

## 2023-03-25 ENCOUNTER — Other Ambulatory Visit (HOSPITAL_COMMUNITY): Payer: Self-pay

## 2023-03-25 MED ORDER — LISDEXAMFETAMINE DIMESYLATE 30 MG PO CAPS
30.0000 mg | ORAL_CAPSULE | Freq: Every morning | ORAL | 0 refills | Status: DC
  Filled 2023-03-25: qty 30, 30d supply, fill #0

## 2023-04-02 DIAGNOSIS — N3281 Overactive bladder: Secondary | ICD-10-CM | POA: Diagnosis not present

## 2023-04-02 DIAGNOSIS — N816 Rectocele: Secondary | ICD-10-CM | POA: Diagnosis not present

## 2023-04-11 DIAGNOSIS — Z125 Encounter for screening for malignant neoplasm of prostate: Secondary | ICD-10-CM | POA: Diagnosis not present

## 2023-04-11 DIAGNOSIS — Z0000 Encounter for general adult medical examination without abnormal findings: Secondary | ICD-10-CM | POA: Diagnosis not present

## 2023-04-11 DIAGNOSIS — F439 Reaction to severe stress, unspecified: Secondary | ICD-10-CM | POA: Diagnosis not present

## 2023-04-11 DIAGNOSIS — Z23 Encounter for immunization: Secondary | ICD-10-CM | POA: Diagnosis not present

## 2023-04-11 DIAGNOSIS — F909 Attention-deficit hyperactivity disorder, unspecified type: Secondary | ICD-10-CM | POA: Diagnosis not present

## 2023-04-11 DIAGNOSIS — E559 Vitamin D deficiency, unspecified: Secondary | ICD-10-CM | POA: Diagnosis not present

## 2023-04-11 DIAGNOSIS — E782 Mixed hyperlipidemia: Secondary | ICD-10-CM | POA: Diagnosis not present

## 2023-04-23 ENCOUNTER — Other Ambulatory Visit (HOSPITAL_COMMUNITY): Payer: Self-pay

## 2023-04-23 MED ORDER — LISDEXAMFETAMINE DIMESYLATE 30 MG PO CAPS
30.0000 mg | ORAL_CAPSULE | Freq: Every morning | ORAL | 0 refills | Status: AC
  Filled 2023-04-23: qty 30, 30d supply, fill #0

## 2023-04-23 MED ORDER — CITALOPRAM HYDROBROMIDE 40 MG PO TABS
40.0000 mg | ORAL_TABLET | Freq: Every morning | ORAL | 3 refills | Status: AC
  Filled 2023-04-23: qty 90, 90d supply, fill #0
  Filled 2023-10-06: qty 90, 90d supply, fill #1

## 2023-04-25 ENCOUNTER — Other Ambulatory Visit (HOSPITAL_COMMUNITY): Payer: Self-pay

## 2023-08-26 ENCOUNTER — Other Ambulatory Visit (HOSPITAL_COMMUNITY): Payer: Self-pay

## 2023-08-26 MED ORDER — LISDEXAMFETAMINE DIMESYLATE 30 MG PO CAPS
30.0000 mg | ORAL_CAPSULE | Freq: Every morning | ORAL | 0 refills | Status: DC
  Filled 2023-08-26: qty 30, 30d supply, fill #0

## 2023-09-25 ENCOUNTER — Other Ambulatory Visit (HOSPITAL_COMMUNITY): Payer: Self-pay

## 2023-09-25 MED ORDER — LISDEXAMFETAMINE DIMESYLATE 30 MG PO CAPS
30.0000 mg | ORAL_CAPSULE | Freq: Every day | ORAL | 0 refills | Status: DC
  Filled 2023-09-25: qty 30, 30d supply, fill #0

## 2023-10-07 ENCOUNTER — Other Ambulatory Visit (HOSPITAL_COMMUNITY): Payer: Self-pay

## 2023-10-29 ENCOUNTER — Other Ambulatory Visit (HOSPITAL_COMMUNITY): Payer: Self-pay

## 2023-10-29 MED ORDER — ATORVASTATIN CALCIUM 20 MG PO TABS
20.0000 mg | ORAL_TABLET | Freq: Every day | ORAL | 3 refills | Status: AC
  Filled 2023-10-29: qty 90, 90d supply, fill #0

## 2023-10-29 MED ORDER — LISDEXAMFETAMINE DIMESYLATE 30 MG PO CAPS
30.0000 mg | ORAL_CAPSULE | Freq: Every morning | ORAL | 0 refills | Status: DC
  Filled 2023-10-29: qty 30, 30d supply, fill #0

## 2023-12-02 ENCOUNTER — Other Ambulatory Visit (HOSPITAL_COMMUNITY): Payer: Self-pay

## 2023-12-02 MED ORDER — LISDEXAMFETAMINE DIMESYLATE 30 MG PO CAPS
30.0000 mg | ORAL_CAPSULE | Freq: Every morning | ORAL | 0 refills | Status: DC
  Filled 2023-12-02: qty 30, 30d supply, fill #0

## 2023-12-03 ENCOUNTER — Other Ambulatory Visit (HOSPITAL_COMMUNITY): Payer: Self-pay

## 2024-01-02 ENCOUNTER — Other Ambulatory Visit (HOSPITAL_COMMUNITY): Payer: Self-pay

## 2024-01-02 MED ORDER — LISDEXAMFETAMINE DIMESYLATE 30 MG PO CAPS
30.0000 mg | ORAL_CAPSULE | Freq: Every morning | ORAL | 0 refills | Status: DC
  Filled 2024-01-02: qty 30, 30d supply, fill #0

## 2024-02-02 ENCOUNTER — Other Ambulatory Visit (HOSPITAL_COMMUNITY): Payer: Self-pay

## 2024-02-02 MED ORDER — LISDEXAMFETAMINE DIMESYLATE 30 MG PO CAPS
30.0000 mg | ORAL_CAPSULE | Freq: Every day | ORAL | 0 refills | Status: DC
  Filled 2024-02-02: qty 30, 30d supply, fill #0

## 2024-02-16 ENCOUNTER — Other Ambulatory Visit (HOSPITAL_BASED_OUTPATIENT_CLINIC_OR_DEPARTMENT_OTHER): Payer: Self-pay

## 2024-02-16 ENCOUNTER — Other Ambulatory Visit (HOSPITAL_COMMUNITY): Payer: Self-pay

## 2024-02-16 MED ORDER — ATORVASTATIN CALCIUM 20 MG PO TABS
20.0000 mg | ORAL_TABLET | Freq: Every day | ORAL | 0 refills | Status: AC
  Filled 2024-02-16: qty 30, 30d supply, fill #0

## 2024-02-16 MED ORDER — CITALOPRAM HYDROBROMIDE 40 MG PO TABS
40.0000 mg | ORAL_TABLET | Freq: Every morning | ORAL | 0 refills | Status: AC
  Filled 2024-02-16: qty 30, 30d supply, fill #0

## 2024-03-02 ENCOUNTER — Other Ambulatory Visit (HOSPITAL_COMMUNITY): Payer: Self-pay

## 2024-03-02 MED ORDER — LISDEXAMFETAMINE DIMESYLATE 30 MG PO CAPS
30.0000 mg | ORAL_CAPSULE | Freq: Every morning | ORAL | 0 refills | Status: DC
  Filled 2024-03-02 (×2): qty 30, 30d supply, fill #0

## 2024-03-31 ENCOUNTER — Other Ambulatory Visit (HOSPITAL_COMMUNITY): Payer: Self-pay

## 2024-03-31 MED ORDER — PREDNISONE 10 MG PO TABS
10.0000 mg | ORAL_TABLET | ORAL | 0 refills | Status: AC
  Filled 2024-03-31: qty 21, 6d supply, fill #0

## 2024-03-31 MED ORDER — CITALOPRAM HYDROBROMIDE 40 MG PO TABS
40.0000 mg | ORAL_TABLET | Freq: Every morning | ORAL | 3 refills | Status: AC
  Filled 2024-03-31: qty 30, 30d supply, fill #0

## 2024-03-31 MED ORDER — ATORVASTATIN CALCIUM 20 MG PO TABS
20.0000 mg | ORAL_TABLET | Freq: Every day | ORAL | 3 refills | Status: AC
  Filled 2024-03-31: qty 30, 30d supply, fill #0

## 2024-03-31 MED ORDER — LISDEXAMFETAMINE DIMESYLATE 30 MG PO CAPS
30.0000 mg | ORAL_CAPSULE | Freq: Every morning | ORAL | 0 refills | Status: DC
  Filled 2024-03-31: qty 30, 30d supply, fill #0

## 2024-04-23 DIAGNOSIS — F439 Reaction to severe stress, unspecified: Secondary | ICD-10-CM | POA: Diagnosis not present

## 2024-04-23 DIAGNOSIS — Z0000 Encounter for general adult medical examination without abnormal findings: Secondary | ICD-10-CM | POA: Diagnosis not present

## 2024-04-23 DIAGNOSIS — E782 Mixed hyperlipidemia: Secondary | ICD-10-CM | POA: Diagnosis not present

## 2024-04-23 DIAGNOSIS — Z125 Encounter for screening for malignant neoplasm of prostate: Secondary | ICD-10-CM | POA: Diagnosis not present

## 2024-04-23 DIAGNOSIS — F909 Attention-deficit hyperactivity disorder, unspecified type: Secondary | ICD-10-CM | POA: Diagnosis not present

## 2024-04-30 ENCOUNTER — Other Ambulatory Visit (HOSPITAL_COMMUNITY): Payer: Self-pay

## 2024-04-30 MED ORDER — LISDEXAMFETAMINE DIMESYLATE 30 MG PO CAPS
30.0000 mg | ORAL_CAPSULE | Freq: Every morning | ORAL | 0 refills | Status: DC
  Filled 2024-04-30: qty 30, 30d supply, fill #0

## 2024-05-18 ENCOUNTER — Other Ambulatory Visit (HOSPITAL_COMMUNITY): Payer: Self-pay

## 2024-05-18 MED ORDER — ATORVASTATIN CALCIUM 20 MG PO TABS
20.0000 mg | ORAL_TABLET | Freq: Every day | ORAL | 3 refills | Status: AC
  Filled 2024-05-18: qty 30, 30d supply, fill #0

## 2024-07-19 DIAGNOSIS — M79605 Pain in left leg: Secondary | ICD-10-CM | POA: Diagnosis not present

## 2024-07-19 DIAGNOSIS — I82812 Embolism and thrombosis of superficial veins of left lower extremities: Secondary | ICD-10-CM | POA: Diagnosis not present

## 2024-07-19 DIAGNOSIS — Z0389 Encounter for observation for other suspected diseases and conditions ruled out: Secondary | ICD-10-CM | POA: Diagnosis not present

## 2024-07-29 ENCOUNTER — Other Ambulatory Visit (HOSPITAL_COMMUNITY): Payer: Self-pay

## 2024-07-29 MED ORDER — LISDEXAMFETAMINE DIMESYLATE 30 MG PO CAPS
30.0000 mg | ORAL_CAPSULE | Freq: Every morning | ORAL | 0 refills | Status: AC
  Filled 2024-07-29: qty 30, 30d supply, fill #0

## 2024-07-30 ENCOUNTER — Other Ambulatory Visit (HOSPITAL_COMMUNITY): Payer: Self-pay

## 2024-08-11 ENCOUNTER — Other Ambulatory Visit (HOSPITAL_COMMUNITY): Payer: Self-pay

## 2024-08-11 MED ORDER — ATORVASTATIN CALCIUM 20 MG PO TABS
20.0000 mg | ORAL_TABLET | Freq: Every day | ORAL | 0 refills | Status: AC
  Filled 2024-08-11: qty 30, 30d supply, fill #0

## 2024-08-12 ENCOUNTER — Other Ambulatory Visit (HOSPITAL_COMMUNITY): Payer: Self-pay

## 2024-08-12 MED ORDER — TADALAFIL 5 MG PO TABS
5.0000 mg | ORAL_TABLET | Freq: Every day | ORAL | 3 refills | Status: AC | PRN
  Filled 2024-08-12: qty 90, 90d supply, fill #0
  Filled 2024-12-03: qty 90, 90d supply, fill #1

## 2024-08-30 ENCOUNTER — Other Ambulatory Visit (HOSPITAL_COMMUNITY): Payer: Self-pay

## 2024-08-30 MED ORDER — LISDEXAMFETAMINE DIMESYLATE 30 MG PO CAPS
30.0000 mg | ORAL_CAPSULE | Freq: Every morning | ORAL | 0 refills | Status: DC
  Filled 2024-08-30: qty 30, 30d supply, fill #0

## 2024-09-27 ENCOUNTER — Other Ambulatory Visit (HOSPITAL_COMMUNITY): Payer: Self-pay

## 2024-09-27 MED ORDER — LISDEXAMFETAMINE DIMESYLATE 30 MG PO CAPS
30.0000 mg | ORAL_CAPSULE | Freq: Every morning | ORAL | 0 refills | Status: DC
  Filled 2024-09-27: qty 30, 30d supply, fill #0

## 2024-10-01 ENCOUNTER — Other Ambulatory Visit (HOSPITAL_COMMUNITY): Payer: Self-pay

## 2024-10-01 MED ORDER — ATORVASTATIN CALCIUM 20 MG PO TABS
20.0000 mg | ORAL_TABLET | Freq: Every day | ORAL | 3 refills | Status: AC
  Filled 2024-10-01: qty 30, 30d supply, fill #0

## 2024-10-14 ENCOUNTER — Other Ambulatory Visit (HOSPITAL_COMMUNITY): Payer: Self-pay

## 2024-10-14 MED ORDER — CITALOPRAM HYDROBROMIDE 40 MG PO TABS
40.0000 mg | ORAL_TABLET | Freq: Every morning | ORAL | 0 refills | Status: AC
  Filled 2024-10-14: qty 30, 30d supply, fill #0

## 2024-10-27 ENCOUNTER — Other Ambulatory Visit (HOSPITAL_COMMUNITY): Payer: Self-pay

## 2024-10-27 MED ORDER — LISDEXAMFETAMINE DIMESYLATE 30 MG PO CAPS
30.0000 mg | ORAL_CAPSULE | Freq: Every morning | ORAL | 0 refills | Status: DC
  Filled 2024-10-27: qty 30, 30d supply, fill #0

## 2024-10-28 ENCOUNTER — Other Ambulatory Visit (HOSPITAL_COMMUNITY): Payer: Self-pay

## 2024-10-28 MED ORDER — FLUZONE 0.5 ML IM SUSY
0.5000 mL | PREFILLED_SYRINGE | Freq: Once | INTRAMUSCULAR | 0 refills | Status: AC
  Filled 2024-10-28: qty 0.5, 1d supply, fill #0

## 2024-11-25 ENCOUNTER — Other Ambulatory Visit (HOSPITAL_COMMUNITY): Payer: Self-pay

## 2024-11-25 MED ORDER — LISDEXAMFETAMINE DIMESYLATE 30 MG PO CAPS
30.0000 mg | ORAL_CAPSULE | Freq: Every morning | ORAL | 0 refills | Status: DC
  Filled 2024-11-25: qty 30, 30d supply, fill #0

## 2024-12-01 ENCOUNTER — Other Ambulatory Visit (HOSPITAL_COMMUNITY): Payer: Self-pay

## 2024-12-01 MED ORDER — CITALOPRAM HYDROBROMIDE 40 MG PO TABS
40.0000 mg | ORAL_TABLET | Freq: Every morning | ORAL | 0 refills | Status: AC
  Filled 2024-12-01: qty 30, 30d supply, fill #0
  Filled 2025-01-21: qty 30, 30d supply, fill #1

## 2024-12-01 MED ORDER — ATORVASTATIN CALCIUM 20 MG PO TABS
20.0000 mg | ORAL_TABLET | Freq: Every day | ORAL | 1 refills | Status: AC
  Filled 2024-12-01: qty 30, 30d supply, fill #0
  Filled 2025-01-11: qty 30, 30d supply, fill #1

## 2024-12-03 ENCOUNTER — Other Ambulatory Visit (HOSPITAL_COMMUNITY): Payer: Self-pay

## 2024-12-27 ENCOUNTER — Other Ambulatory Visit (HOSPITAL_COMMUNITY): Payer: Self-pay

## 2024-12-27 MED ORDER — LISDEXAMFETAMINE DIMESYLATE 30 MG PO CAPS
30.0000 mg | ORAL_CAPSULE | ORAL | 0 refills | Status: DC
  Filled 2024-12-27: qty 30, 30d supply, fill #0

## 2024-12-27 MED ORDER — TRETINOIN 0.1 % EX CREA
1.0000 | TOPICAL_CREAM | Freq: Every evening | CUTANEOUS | 0 refills | Status: AC
  Filled 2024-12-27: qty 45, 90d supply, fill #0

## 2024-12-28 ENCOUNTER — Other Ambulatory Visit (HOSPITAL_COMMUNITY): Payer: Self-pay

## 2025-01-26 ENCOUNTER — Other Ambulatory Visit (HOSPITAL_COMMUNITY): Payer: Self-pay

## 2025-01-26 MED ORDER — LISDEXAMFETAMINE DIMESYLATE 30 MG PO CAPS
30.0000 mg | ORAL_CAPSULE | Freq: Every morning | ORAL | 0 refills | Status: AC
  Filled 2025-01-26: qty 30, 30d supply, fill #0
